# Patient Record
Sex: Female | Born: 1990 | Race: White | Hispanic: No | Smoking: Current every day smoker
Health system: Southern US, Community
[De-identification: ages and names within clinical notes are randomized; demographics above are authoritative.]

## PROBLEM LIST (undated history)

## (undated) DIAGNOSIS — J45909 Unspecified asthma, uncomplicated: Secondary | ICD-10-CM

## (undated) HISTORY — PX: TUBAL LIGATION: SHX77

## (undated) HISTORY — PX: WISDOM TOOTH EXTRACTION: SHX21

---

## 2008-09-27 ENCOUNTER — Emergency Department (HOSPITAL_COMMUNITY): Admission: EM | Admit: 2008-09-27 | Discharge: 2008-09-27 | Payer: Self-pay | Admitting: Emergency Medicine

## 2016-02-16 ENCOUNTER — Observation Stay (HOSPITAL_COMMUNITY)
Admission: EM | Admit: 2016-02-16 | Discharge: 2016-02-17 | Disposition: A | Discharge status: Home / Self Care | Payer: Medicaid Other | Attending: Family Medicine | Admitting: Family Medicine

## 2016-02-16 ENCOUNTER — Emergency Department (HOSPITAL_COMMUNITY): Payer: Medicaid Other

## 2016-02-16 ENCOUNTER — Encounter (HOSPITAL_COMMUNITY): Payer: Self-pay

## 2016-02-16 DIAGNOSIS — E876 Hypokalemia: Secondary | ICD-10-CM | POA: Diagnosis not present

## 2016-02-16 DIAGNOSIS — G92 Toxic encephalopathy: Secondary | ICD-10-CM | POA: Insufficient documentation

## 2016-02-16 DIAGNOSIS — Y909 Presence of alcohol in blood, level not specified: Secondary | ICD-10-CM | POA: Insufficient documentation

## 2016-02-16 DIAGNOSIS — R4182 Altered mental status, unspecified: Secondary | ICD-10-CM

## 2016-02-16 DIAGNOSIS — I959 Hypotension, unspecified: Secondary | ICD-10-CM | POA: Insufficient documentation

## 2016-02-16 DIAGNOSIS — F10921 Alcohol use, unspecified with intoxication delirium: Secondary | ICD-10-CM | POA: Diagnosis not present

## 2016-02-16 DIAGNOSIS — F1410 Cocaine abuse, uncomplicated: Secondary | ICD-10-CM

## 2016-02-16 DIAGNOSIS — F10129 Alcohol abuse with intoxication, unspecified: Secondary | ICD-10-CM | POA: Diagnosis not present

## 2016-02-16 DIAGNOSIS — X31XXXA Exposure to excessive natural cold, initial encounter: Secondary | ICD-10-CM | POA: Insufficient documentation

## 2016-02-16 DIAGNOSIS — R7989 Other specified abnormal findings of blood chemistry: Secondary | ICD-10-CM | POA: Diagnosis not present

## 2016-02-16 DIAGNOSIS — T68XXXA Hypothermia, initial encounter: Secondary | ICD-10-CM | POA: Diagnosis not present

## 2016-02-16 DIAGNOSIS — F10929 Alcohol use, unspecified with intoxication, unspecified: Secondary | ICD-10-CM

## 2016-02-16 DIAGNOSIS — F14129 Cocaine abuse with intoxication, unspecified: Secondary | ICD-10-CM | POA: Insufficient documentation

## 2016-02-16 HISTORY — DX: Unspecified asthma, uncomplicated: J45.909

## 2016-02-16 LAB — CBC WITH DIFFERENTIAL/PLATELET
Basophils Absolute: 0.1 10*3/uL (ref 0.0–0.1)
Basophils Relative: 1 %
EOS ABS: 0 10*3/uL (ref 0.0–0.7)
Eosinophils Relative: 0 %
HCT: 40.4 % (ref 36.0–46.0)
HEMOGLOBIN: 14 g/dL (ref 12.0–15.0)
LYMPHS ABS: 3.2 10*3/uL (ref 0.7–4.0)
Lymphocytes Relative: 27 %
MCH: 33.4 pg (ref 26.0–34.0)
MCHC: 34.7 g/dL (ref 30.0–36.0)
MCV: 96.4 fL (ref 78.0–100.0)
Monocytes Absolute: 0.6 10*3/uL (ref 0.1–1.0)
Monocytes Relative: 5 %
NEUTROS ABS: 7.9 10*3/uL — AB (ref 1.7–7.7)
NEUTROS PCT: 67 %
Platelets: 289 10*3/uL (ref 150–400)
RBC: 4.19 MIL/uL (ref 3.87–5.11)
RDW: 12.3 % (ref 11.5–15.5)
WBC: 11.7 10*3/uL — AB (ref 4.0–10.5)

## 2016-02-16 LAB — RAPID URINE DRUG SCREEN, HOSP PERFORMED
Amphetamines: NOT DETECTED
BARBITURATES: NOT DETECTED
Benzodiazepines: NOT DETECTED
Cocaine: POSITIVE — AB
Opiates: NOT DETECTED
Tetrahydrocannabinol: NOT DETECTED

## 2016-02-16 LAB — URINALYSIS, ROUTINE W REFLEX MICROSCOPIC
Bilirubin Urine: NEGATIVE
Glucose, UA: NEGATIVE mg/dL
Ketones, ur: NEGATIVE mg/dL
NITRITE: NEGATIVE
PROTEIN: NEGATIVE mg/dL
pH: 6.5 (ref 5.0–8.0)

## 2016-02-16 LAB — COMPREHENSIVE METABOLIC PANEL
ALBUMIN: 4.2 g/dL (ref 3.5–5.0)
ALT: 13 U/L — ABNORMAL LOW (ref 14–54)
AST: 18 U/L (ref 15–41)
Alkaline Phosphatase: 90 U/L (ref 38–126)
Anion gap: 10 (ref 5–15)
BUN: 10 mg/dL (ref 6–20)
CHLORIDE: 111 mmol/L (ref 101–111)
CO2: 20 mmol/L — AB (ref 22–32)
Calcium: 8.8 mg/dL — ABNORMAL LOW (ref 8.9–10.3)
Creatinine, Ser: 0.52 mg/dL (ref 0.44–1.00)
GFR calc Af Amer: >60 mL/min (ref >60)
GLUCOSE: 124 mg/dL — AB (ref 65–99)
POTASSIUM: 3.2 mmol/L — AB (ref 3.5–5.1)
SODIUM: 141 mmol/L (ref 135–145)
Total Bilirubin: 0.2 mg/dL — ABNORMAL LOW (ref 0.3–1.2)
Total Protein: 7.5 g/dL (ref 6.5–8.1)

## 2016-02-16 LAB — ACETAMINOPHEN LEVEL

## 2016-02-16 LAB — LACTIC ACID, PLASMA
LACTIC ACID, VENOUS: 2.2 mmol/L — AB (ref 0.5–1.9)
LACTIC ACID, VENOUS: 1.9 mmol/L (ref 0.5–1.9)

## 2016-02-16 LAB — URINE MICROSCOPIC-ADD ON

## 2016-02-16 LAB — MRSA PCR SCREENING: MRSA BY PCR: NEGATIVE

## 2016-02-16 LAB — ETHANOL: Alcohol, Ethyl (B): 465 mg/dL (ref <5)

## 2016-02-16 LAB — SALICYLATE LEVEL: Salicylate Lvl: <7 mg/dL (ref 2.8–30.0)

## 2016-02-16 LAB — CK: Total CK: 158 U/L (ref 38–234)

## 2016-02-16 LAB — PREGNANCY, URINE: PREG TEST UR: NEGATIVE

## 2016-02-16 MED ORDER — ORAL CARE MOUTH RINSE: 15.0000 mL | Freq: Two times a day (BID) | OROMUCOSAL | Status: DC

## 2016-02-16 MED ORDER — SODIUM CHLORIDE 0.9 % IV BOLUS (SEPSIS)
1000.0000 mL | Freq: Once | INTRAVENOUS | Status: AC
  Administered 2016-02-16: 1000 mL via INTRAVENOUS

## 2016-02-16 MED ORDER — LORAZEPAM 2 MG/ML IJ SOLN: 1.0000 mg | Freq: Once | INTRAMUSCULAR | Status: DC

## 2016-02-16 MED ORDER — CHLORHEXIDINE GLUCONATE 0.12 % MT SOLN: 15.0000 mL | Freq: Two times a day (BID) | OROMUCOSAL | Status: DC

## 2016-02-16 MED ORDER — SODIUM CHLORIDE 0.9 % IV SOLN
INTRAVENOUS | Status: DC
  Administered 2016-02-16 – 2016-02-17 (×3): via INTRAVENOUS

## 2016-02-16 MED ORDER — ADULT MULTIVITAMIN W/MINERALS CH: 1.0000 | ORAL_TABLET | Freq: Every day | ORAL | Status: DC

## 2016-02-16 MED ORDER — LORAZEPAM 2 MG/ML IJ SOLN: 0.0000 mg | Freq: Two times a day (BID) | INTRAMUSCULAR | Status: DC

## 2016-02-16 MED ORDER — LORAZEPAM 2 MG/ML IJ SOLN
INTRAMUSCULAR | Status: AC
  Administered 2016-02-16: 1 mg
  Filled 2016-02-16: qty 1

## 2016-02-16 MED ORDER — ONDANSETRON HCL 4 MG PO TABS: 4.0000 mg | ORAL_TABLET | Freq: Four times a day (QID) | ORAL | Status: DC | PRN

## 2016-02-16 MED ORDER — ONDANSETRON 4 MG PO TBDP: 4.0000 mg | ORAL_TABLET | Freq: Four times a day (QID) | ORAL | Status: DC | PRN

## 2016-02-16 MED ORDER — THIAMINE HCL 100 MG/ML IJ SOLN: 100.0000 mg | Freq: Once | INTRAMUSCULAR | Status: DC

## 2016-02-16 MED ORDER — SODIUM CHLORIDE 0.9% FLUSH
3.0000 mL | Freq: Two times a day (BID) | INTRAVENOUS | Status: DC
  Administered 2016-02-17: 3 mL via INTRAVENOUS

## 2016-02-16 MED ORDER — FOLIC ACID 1 MG PO TABS
1.0000 mg | ORAL_TABLET | Freq: Every day | ORAL | Status: DC
  Administered 2016-02-17: 1 mg via ORAL
  Filled 2016-02-16: qty 1

## 2016-02-16 MED ORDER — SODIUM CHLORIDE 0.9 % IV SOLN
INTRAVENOUS | Status: DC
  Administered 2016-02-16: 12:00:00 via INTRAVENOUS

## 2016-02-16 MED ORDER — LORAZEPAM 1 MG PO TABS: 1.0000 mg | ORAL_TABLET | Freq: Four times a day (QID) | ORAL | Status: DC

## 2016-02-16 MED ORDER — ACETAMINOPHEN 500 MG PO TABS: 1000.0000 mg | ORAL_TABLET | Freq: Four times a day (QID) | ORAL | Status: DC | PRN

## 2016-02-16 MED ORDER — ADULT MULTIVITAMIN W/MINERALS CH
1.0000 | ORAL_TABLET | Freq: Every day | ORAL | Status: DC
  Administered 2016-02-17: 1 via ORAL
  Filled 2016-02-16: qty 1

## 2016-02-16 MED ORDER — LORAZEPAM 1 MG PO TABS: 1.0000 mg | ORAL_TABLET | Freq: Two times a day (BID) | ORAL | Status: DC

## 2016-02-16 MED ORDER — VITAMIN B-1 100 MG PO TABS
100.0000 mg | ORAL_TABLET | Freq: Every day | ORAL | Status: DC
  Administered 2016-02-17: 100 mg via ORAL
  Filled 2016-02-16: qty 1

## 2016-02-16 MED ORDER — POTASSIUM CHLORIDE CRYS ER 20 MEQ PO TBCR
40.0000 meq | EXTENDED_RELEASE_TABLET | Freq: Once | ORAL | Status: AC
  Administered 2016-02-16: 40 meq via ORAL
  Filled 2016-02-16: qty 2

## 2016-02-16 MED ORDER — LOPERAMIDE HCL 2 MG PO CAPS: 2.0000 mg | ORAL_CAPSULE | ORAL | Status: DC | PRN

## 2016-02-16 MED ORDER — VITAMIN B-1 100 MG PO TABS: 100.0000 mg | ORAL_TABLET | Freq: Every day | ORAL | Status: DC

## 2016-02-16 MED ORDER — LORAZEPAM 1 MG PO TABS: 1.0000 mg | ORAL_TABLET | Freq: Four times a day (QID) | ORAL | Status: DC | PRN

## 2016-02-16 MED ORDER — LORAZEPAM 1 MG PO TABS: 1.0000 mg | ORAL_TABLET | Freq: Every day | ORAL | Status: DC

## 2016-02-16 MED ORDER — IBUPROFEN 400 MG PO TABS
600.0000 mg | ORAL_TABLET | Freq: Four times a day (QID) | ORAL | Status: DC | PRN
  Administered 2016-02-17 (×2): 600 mg via ORAL
  Filled 2016-02-16 (×2): qty 2

## 2016-02-16 MED ORDER — LORAZEPAM 2 MG/ML IJ SOLN
0.0000 mg | Freq: Four times a day (QID) | INTRAMUSCULAR | Status: DC
  Administered 2016-02-16 – 2016-02-17 (×3): 2 mg via INTRAVENOUS
  Filled 2016-02-16 (×4): qty 1

## 2016-02-16 MED ORDER — HYDROXYZINE HCL 25 MG PO TABS: 25.0000 mg | ORAL_TABLET | Freq: Four times a day (QID) | ORAL | Status: DC | PRN

## 2016-02-16 MED ORDER — LORAZEPAM 2 MG/ML IJ SOLN: 1.0000 mg | Freq: Four times a day (QID) | INTRAMUSCULAR | Status: DC | PRN

## 2016-02-16 MED ORDER — THIAMINE HCL 100 MG/ML IJ SOLN: 100.0000 mg | Freq: Every day | INTRAMUSCULAR | Status: DC

## 2016-02-16 MED ORDER — LORAZEPAM 1 MG PO TABS: 1.0000 mg | ORAL_TABLET | Freq: Three times a day (TID) | ORAL | Status: DC

## 2016-02-16 MED ORDER — ONDANSETRON HCL 4 MG/2ML IJ SOLN: 4.0000 mg | Freq: Four times a day (QID) | INTRAMUSCULAR | Status: DC | PRN

## 2016-02-16 MED ORDER — NICOTINE 14 MG/24HR TD PT24
14.0000 mg | MEDICATED_PATCH | Freq: Every day | TRANSDERMAL | Status: DC
  Administered 2016-02-16: 14 mg via TRANSDERMAL
  Filled 2016-02-16: qty 1

## 2016-02-16 MED ORDER — LORAZEPAM 1 MG PO TABS
1.0000 mg | ORAL_TABLET | Freq: Four times a day (QID) | ORAL | Status: DC | PRN
  Administered 2016-02-17: 1 mg via ORAL
  Filled 2016-02-16: qty 1

## 2016-02-16 NOTE — ED Notes (Signed)
CRITICAL VALUE ALERT  Critical value received:  etoh 465 Date of notification:  02/16/2016  Time of notification:  0856  Critical value read back:Yes.    Nurse who received alert:  [facility] RN  MD notified (1st page):  Dr. Clarene DukeMcManus  Time of first page:  [phone]  MD notified (2nd page):  Time of second page:  Responding MD:  Dr. Clarene DukeMcManus  Time MD responded:  [phone]

## 2016-02-16 NOTE — ED Notes (Signed)
CRITICAL VALUE ALERT  Critical value received:  Lactic Acid - 2.2  Date of notification:  02/16/2016  Time of notification:  0850  Critical value read back: yes  Nurse who received alert:  Camillia HerterSandra Kueider RN  MD notified (1st page):  Dr Clarene DukeMcManus  Time of first page:  0850  MD notified (2nd page):  Time of second page:  Responding MD:  Dr Clarene DukeMcManus  Time MD responded:  [phone]

## 2016-02-16 NOTE — ED Triage Notes (Signed)
Pt here via EMS. States pt was found lying on a ? Friends carport with a blanket on her. States pt was combative and cursing on their arrival . Pt was given Haldol 5 mg IM by EMS prior to arrival. Police brought in pt's two children that were at the scene. DDS here with police. Patient cursing, calling every one " GD fucking bitches "

## 2016-02-16 NOTE — Progress Notes (Signed)
Md notified of pt wanting to leave hospital, agitation and tremors. New orders received and noted.

## 2016-02-16 NOTE — ED Notes (Signed)
Patient remains sleeping at this time. Patient is arouseable by verbal stimuli. Bair hugger remains on patient. Warm fluids being given as bolus.

## 2016-02-16 NOTE — ED Provider Notes (Signed)
[facility]-EMERGENCY DEPT Provider Note   CSN: [id number] Arrival date & time: 02/16/16  0734     History   Chief Complaint Chief Complaint  Patient presents with  . Alcohol Intoxication    HPI Danielle Cain is a 25 y.o. female.  The history is provided by the patient, the EMS personnel and the police. The history is limited by the condition of the patient (AMS, intoxicated).  Alcohol Intoxication     Pt was seen at 0740.  Per EMS and Police: Pt was at a party last night, found by friends outside "passed out" at 0300. Friends apparently placed a blanket over her (outside temp approximately 28 degrees) because when awakened pt became combative and agitated. Police state they were called approximately 0500. Police called DSS to care for pt's 2 children that were on scene. EMS gave pt IM haldol 5mg  due to pt combativeness and agitation on scene and during transport. Pt arrival to ED shackled to stretcher x4 extremities, grabbing at staff and cursing, calling everyone "god damn fucking bitches." States she "took a lot of stuff" then laughs and returns cursing.    No past medical history on file.  There are no active problems to display for this patient.   No past surgical history on file.  OB History    No data available       Home Medications    Prior to Admission medications   Not on File    Family History  Social History Social History  Substance Use Topics  . Smoking status: Not on file  . Smokeless tobacco: Not on file  . Alcohol use Not on file     Allergies   Patient has no allergy information on record.   Review of Systems Review of Systems  Unable to perform ROS: Mental status change     Physical Exam Updated Vital Signs BP 101/69 (BP Location: Left Arm)   Pulse 88   Temp (!) 95 F (35 C) (Core (Comment))   Resp 10   SpO2 99%    Patient Vitals for the past 24 hrs:  BP Temp Temp src Pulse Resp SpO2  02/16/16 1200 (!) 84/46 97 F (36.1 C) - 86  16 99 %  02/16/16 1130 (!) 83/43 (!) 96.4 F (35.8 C) - 80 16 100 %  02/16/16 1100 (!) 80/45 (!) 95.9 F (35.5 C) - 74 15 100 %  02/16/16 1030 (!) 81/41 (!) 95.4 F (35.2 C) - 69 15 100 %  02/16/16 1000 90/56 (!) 95.4 F (35.2 C) - 77 16 100 %  02/16/16 0930 (!) 83/47 (!) 95.4 F (35.2 C) - (!) 56 14 100 %  02/16/16 0908 96/66 (!) 95 F (35 C) Core 60 15 100 %  02/16/16 0830 (!) 82/48 (!) 94.8 F (34.9 C) - (!) 59 16 99 %  02/16/16 0806 101/69 (!) 95 F (35 C) Core 88 10 99 %  02/16/16 0800 101/69 (!) 95 F (35 C) - 86 15 99 %     Physical Exam 0745: Physical examination:  Nursing notes reviewed; Vital signs and O2 SAT reviewed;  Constitutional: Well developed, Well nourished, Well hydrated, In no acute distress; Head:  Normocephalic, +mild localized erythema and edema to left lateral zygoma area.; Eyes: EOMI, pupils dilated bilat. No scleral icterus; ENMT: Mouth and pharynx normal, Mucous membranes dry and crusted; Neck: Supple, Full range of motion, No lymphadenopathy; Cardiovascular: Regular rate and rhythm, No gallop; Respiratory: Breath sounds clear & equal  bilaterally, No wheezes.  Speaking full sentences with ease, Normal respiratory effort/excursion; Chest: Nontender, Movement normal; Abdomen: Soft, Nontender, Nondistended, Normal bowel sounds; Genitourinary: No CVA tenderness; Extremities: +ecchymosis right anterior tibial area. Pulses normal, No deformity, no tenderness, No edema, No calf edema or asymmetry.; Neuro: Awake, alert, confused re: events. Major CN grossly intact.  Speech slurred. Moves all extremities spontaneously without apparent gross focal motor deficits.; Skin: Color normal, cool, Dry.   ED Treatments / Results  Labs (all labs ordered are listed, but only abnormal results are displayed)   EKG  EKG Interpretation  Date/Time:  Tuesday February 16 2016 08:18:24 EST Ventricular Rate:  58 PR Interval:    QRS Duration: 87 QT Interval:  458 QTC  Calculation: 450 R Axis:   82 Text Interpretation:  Sinus rhythm Baseline wander Artifact SUGGEST REPEAT TRACING Confirmed by [facility]  MD, Nicholos Johns [phone]) on 02/16/2016 8:38:08 AM       Radiology   Procedures Procedures (including critical care time)  Medications Ordered in ED Medications - No data to display   Initial Impression / Assessment and Plan / ED Course  I have reviewed the triage vital signs and the nursing notes.  Pertinent labs & imaging results that were available during my care of the patient were reviewed by me and considered in my medical decision making (see chart for details).  MDM Reviewed: nursing note and vitals Interpretation: labs, ECG, x-ray and CT scan Total time providing critical care: 30-74 minutes. This excludes time spent performing separately reportable procedures and services. Consults: admitting MD   CRITICAL CARE Performed by: Laray Anger Total critical care time: 35 minutes Critical care time was exclusive of separately billable procedures and treating other patients. Critical care was necessary to treat or prevent imminent or life-threatening deterioration. Critical care was time spent personally by me on the following activities: development of treatment plan with patient and/or surrogate as well as nursing, discussions with consultants, evaluation of patient's response to treatment, examination of patient, obtaining history from patient or surrogate, ordering and performing treatments and interventions, ordering and review of laboratory studies, ordering and review of radiographic studies, pulse oximetry and re-evaluation of patient's condition.   Dg Chest 1 View Result Date: 02/16/2016 CLINICAL DATA:  Found unresponsive EXAM: CHEST 1 VIEW COMPARISON:  None. FINDINGS: Heart and mediastinal contours are within normal limits. No focal opacities or effusions. No acute bony abnormality. IMPRESSION: No active disease. Electronically  Signed   By: Charlett Nose M.D.   On: 02/16/2016 09:01   Ct Head Wo Contrast Result Date: 02/16/2016 CLINICAL DATA:  Found outside. Combative. Swelling around the left eye. EXAM: CT HEAD WITHOUT CONTRAST CT MAXILLOFACIAL WITHOUT CONTRAST CT CERVICAL SPINE WITHOUT CONTRAST TECHNIQUE: Multidetector CT imaging of the head, cervical spine, and maxillofacial structures were performed using the standard protocol without intravenous contrast. Multiplanar CT image reconstructions of the cervical spine and maxillofacial structures were also generated. COMPARISON:  None. FINDINGS: CT HEAD FINDINGS Brain: Normal. No evidence of acute infarction, hemorrhage, hydrocephalus, extra-axial collection or mass lesion/mass effect. Vascular: No hyperdense vessel or unexpected calcification. Skull: Negative for fracture CT MAXILLOFACIAL FINDINGS Osseous: No fracture or mandibular dislocation. Dental caries with bilateral lower premolar periapical erosions. Orbits: Negative. No traumatic or inflammatory finding. Sinuses: Clear. Soft tissues: Negative. CT CERVICAL SPINE FINDINGS Alignment: No traumatic malalignment. Skull base and vertebrae: No evidence of acute fracture. Irregularity at the level of the C5-C7 vertebrae is best attributed to motion artifact. Minimal superior endplate height loss at  T1 and T2 has a chronic appearance. Soft tissues and spinal canal: No prevertebral fluid or swelling. No visible canal hematoma. Disc levels:  No significant degenerative change. Upper chest: Negative IMPRESSION: 1. Negative head CT. 2. No acute finding in the face.  Negative for facial fracture. 3. Motion degraded cervical spine CT without evidence of acute injury. Electronically Signed   By: Marnee SpringJonathon  Watts M.D.   On: 02/16/2016 09:12   Ct Cervical Spine Wo Contrast Result Date: 02/16/2016 CLINICAL DATA:  Found outside. Combative. Swelling around the left eye. EXAM: CT HEAD WITHOUT CONTRAST CT MAXILLOFACIAL WITHOUT CONTRAST CT CERVICAL  SPINE WITHOUT CONTRAST TECHNIQUE: Multidetector CT imaging of the head, cervical spine, and maxillofacial structures were performed using the standard protocol without intravenous contrast. Multiplanar CT image reconstructions of the cervical spine and maxillofacial structures were also generated. COMPARISON:  None. FINDINGS: CT HEAD FINDINGS Brain: Normal. No evidence of acute infarction, hemorrhage, hydrocephalus, extra-axial collection or mass lesion/mass effect. Vascular: No hyperdense vessel or unexpected calcification. Skull: Negative for fracture CT MAXILLOFACIAL FINDINGS Osseous: No fracture or mandibular dislocation. Dental caries with bilateral lower premolar periapical erosions. Orbits: Negative. No traumatic or inflammatory finding. Sinuses: Clear. Soft tissues: Negative. CT CERVICAL SPINE FINDINGS Alignment: No traumatic malalignment. Skull base and vertebrae: No evidence of acute fracture. Irregularity at the level of the C5-C7 vertebrae is best attributed to motion artifact. Minimal superior endplate height loss at T1 and T2 has a chronic appearance. Soft tissues and spinal canal: No prevertebral fluid or swelling. No visible canal hematoma. Disc levels:  No significant degenerative change. Upper chest: Negative IMPRESSION: 1. Negative head CT. 2. No acute finding in the face.  Negative for facial fracture. 3. Motion degraded cervical spine CT without evidence of acute injury. Electronically Signed   By: Marnee SpringJonathon  Watts M.D.   On: 02/16/2016 09:12   Ct Maxillofacial Wo Cm Result Date: 02/16/2016 CLINICAL DATA:  Found outside. Combative. Swelling around the left eye. EXAM: CT HEAD WITHOUT CONTRAST CT MAXILLOFACIAL WITHOUT CONTRAST CT CERVICAL SPINE WITHOUT CONTRAST TECHNIQUE: Multidetector CT imaging of the head, cervical spine, and maxillofacial structures were performed using the standard protocol without intravenous contrast. Multiplanar CT image reconstructions of the cervical spine and  maxillofacial structures were also generated. COMPARISON:  None. FINDINGS: CT HEAD FINDINGS Brain: Normal. No evidence of acute infarction, hemorrhage, hydrocephalus, extra-axial collection or mass lesion/mass effect. Vascular: No hyperdense vessel or unexpected calcification. Skull: Negative for fracture CT MAXILLOFACIAL FINDINGS Osseous: No fracture or mandibular dislocation. Dental caries with bilateral lower premolar periapical erosions. Orbits: Negative. No traumatic or inflammatory finding. Sinuses: Clear. Soft tissues: Negative. CT CERVICAL SPINE FINDINGS Alignment: No traumatic malalignment. Skull base and vertebrae: No evidence of acute fracture. Irregularity at the level of the C5-C7 vertebrae is best attributed to motion artifact. Minimal superior endplate height loss at T1 and T2 has a chronic appearance. Soft tissues and spinal canal: No prevertebral fluid or swelling. No visible canal hematoma. Disc levels:  No significant degenerative change. Upper chest: Negative IMPRESSION: 1. Negative head CT. 2. No acute finding in the face.  Negative for facial fracture. 3. Motion degraded cervical spine CT without evidence of acute injury. Electronically Signed   By: Marnee SpringJonathon  Watts M.D.   On: 02/16/2016 09:12    1050:  Pt initially combative and agitated on arrival to ED. Slowly fell asleep (s/p IM haldol en route). Remains arousable to verbal stimuli. Multiple IVF boluses given for low BP and elevated lactic acid with slow improvement.  Bair hugger in place, temp slowly increasing.  T/C to [facility] Dr. Irene LimboGoodrich, case discussed, including:  HPI, pertinent PM/SHx, VS/PE, dx testing, ED course and treatment:  Agreeable to admit, requests to write temporary orders, obtain stepdown bed to team APAdmits.       Final Clinical Impressions(s) / ED Diagnoses   Final diagnoses:  None    New Prescriptions New Prescriptions   No medications on file      Samuel JesterKathleen Demareon Coldwell, DO 02/20/16 0022
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
## 2016-02-17 ENCOUNTER — Encounter (HOSPITAL_COMMUNITY): Payer: Self-pay

## 2016-02-17 DIAGNOSIS — T68XXXA Hypothermia, initial encounter: Secondary | ICD-10-CM

## 2016-02-17 DIAGNOSIS — F10921 Alcohol use, unspecified with intoxication delirium: Secondary | ICD-10-CM

## 2016-02-17 LAB — BASIC METABOLIC PANEL
Anion gap: 7 (ref 5–15)
Anion gap: 7 (ref 5–15)
BUN: 7 mg/dL (ref 6–20)
BUN: 6 mg/dL (ref 6–20)
CHLORIDE: 107 mmol/L (ref 101–111)
CHLORIDE: 106 mmol/L (ref 101–111)
CO2: 21 mmol/L — AB (ref 22–32)
CO2: 19 mmol/L — AB (ref 22–32)
CREATININE: 0.68 mg/dL (ref 0.44–1.00)
CREATININE: 0.52 mg/dL (ref 0.44–1.00)
Calcium: 7.8 mg/dL — ABNORMAL LOW (ref 8.9–10.3)
Calcium: 8.2 mg/dL — ABNORMAL LOW (ref 8.9–10.3)
GFR calc Af Amer: >60 mL/min (ref >60)
GFR calc Af Amer: >60 mL/min (ref >60)
GFR calc non Af Amer: >60 mL/min (ref >60)
GFR calc non Af Amer: >60 mL/min (ref >60)
GLUCOSE: 118 mg/dL — AB (ref 65–99)
Glucose, Bld: 100 mg/dL — ABNORMAL HIGH (ref 65–99)
POTASSIUM: 3.2 mmol/L — AB (ref 3.5–5.1)
POTASSIUM: 3.9 mmol/L (ref 3.5–5.1)
SODIUM: 135 mmol/L (ref 135–145)
SODIUM: 132 mmol/L — AB (ref 135–145)

## 2016-02-17 MED ORDER — FOLIC ACID 1 MG PO TABS: 1.0000 mg | ORAL_TABLET | Freq: Every day | ORAL | 2 refills | Status: AC

## 2016-02-17 MED ORDER — POTASSIUM CHLORIDE CRYS ER 20 MEQ PO TBCR
40.0000 meq | EXTENDED_RELEASE_TABLET | ORAL | Status: AC
  Administered 2016-02-17 (×2): 40 meq via ORAL
  Filled 2016-02-17 (×2): qty 2

## 2016-02-17 MED ORDER — THIAMINE HCL 100 MG PO TABS: 100.0000 mg | ORAL_TABLET | Freq: Every day | ORAL | 2 refills | Status: AC

## 2016-02-17 MED ORDER — NICOTINE 21 MG/24HR TD PT24
21.0000 mg | MEDICATED_PATCH | Freq: Every day | TRANSDERMAL | Status: DC
  Administered 2016-02-17: 21 mg via TRANSDERMAL
  Filled 2016-02-17: qty 1

## 2016-02-17 NOTE — Progress Notes (Signed)
Midlevel notified of pt reported pain to buttocks and head rated 8/10. Pt refused both tylenol and ibuprofen and ice pack.

## 2016-02-17 NOTE — Care Management Note (Signed)
Case Management Note  Patient Details  Name: Danielle Cain MRN: [id number] Date of Birth: 16-Aug-1990  Subjective/Objective:                  Pt admitted with ETOH intoxication. Pt is from home, ind with ADL's. Pt has medicaid [facility] Pt plans to return home with self care. Pt has seen CSW for substance abuse.   Action/Plan: No CM needs anticipated. Pt anticipates DC home this afternoon.   Expected Discharge Date:     02/17/2016             Expected Discharge Plan:  Home/Self Care  In-House Referral:  Clinical Social Work  Discharge planning Services  CM Consult  Post Acute Care Choice:  NA Choice offered to:  NA  Status of Service:  Completed, signed off  Malcolm MetroChildress, Otilia Demske, RN 02/17/2016, 3:55 PM
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
## 2016-02-17 NOTE — Progress Notes (Signed)
Midlevel notified of pt request to smoke. New orders for nicotine patch.
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
## 2016-02-17 NOTE — Progress Notes (Signed)
Patient discharged to home.  Instruction and prescription given to patient. Verbalized understanding.  Patient walking down to car with family members.

## 2016-03-03 ENCOUNTER — Encounter (HOSPITAL_COMMUNITY): Payer: Self-pay | Admitting: Emergency Medicine

## 2016-03-03 ENCOUNTER — Emergency Department (HOSPITAL_COMMUNITY): Payer: Medicaid Other

## 2016-03-03 ENCOUNTER — Emergency Department (HOSPITAL_COMMUNITY)
Admission: EM | Admit: 2016-03-03 | Discharge: 2016-03-03 | Disposition: A | Discharge status: Home / Self Care | Payer: Medicaid Other | Attending: Emergency Medicine | Admitting: Emergency Medicine

## 2016-03-03 DIAGNOSIS — R0789 Other chest pain: Secondary | ICD-10-CM | POA: Diagnosis not present

## 2016-03-03 DIAGNOSIS — F17210 Nicotine dependence, cigarettes, uncomplicated: Secondary | ICD-10-CM | POA: Insufficient documentation

## 2016-03-03 DIAGNOSIS — J45909 Unspecified asthma, uncomplicated: Secondary | ICD-10-CM | POA: Insufficient documentation

## 2016-03-03 DIAGNOSIS — R079 Chest pain, unspecified: Secondary | ICD-10-CM | POA: Diagnosis present

## 2016-03-03 LAB — CBC
HCT: 39.4 % (ref 36.0–46.0)
HEMOGLOBIN: 13.5 g/dL (ref 12.0–15.0)
MCH: 33 pg (ref 26.0–34.0)
MCHC: 34.3 g/dL (ref 30.0–36.0)
MCV: 96.3 fL (ref 78.0–100.0)
Platelets: 321 10*3/uL (ref 150–400)
RBC: 4.09 MIL/uL (ref 3.87–5.11)
RDW: 12.3 % (ref 11.5–15.5)
WBC: 10.7 10*3/uL — ABNORMAL HIGH (ref 4.0–10.5)

## 2016-03-03 LAB — BASIC METABOLIC PANEL
ANION GAP: 10 (ref 5–15)
BUN: 13 mg/dL (ref 6–20)
CALCIUM: 9.7 mg/dL (ref 8.9–10.3)
CO2: 22 mmol/L (ref 22–32)
CREATININE: 0.66 mg/dL (ref 0.44–1.00)
Chloride: 105 mmol/L (ref 101–111)
GLUCOSE: 106 mg/dL — AB (ref 65–99)
Potassium: 4.1 mmol/L (ref 3.5–5.1)
Sodium: 137 mmol/L (ref 135–145)

## 2016-03-03 LAB — I-STAT TROPONIN, ED
TROPONIN I, POC: 0 ng/mL (ref 0.00–0.08)
Troponin i, poc: 0 ng/mL (ref 0.00–0.08)

## 2016-03-03 MED ORDER — GI COCKTAIL ~~LOC~~
30.0000 mL | Freq: Once | ORAL | Status: AC
  Administered 2016-03-03: 30 mL via ORAL
  Filled 2016-03-03: qty 30

## 2016-03-03 NOTE — ED Triage Notes (Signed)
EMS vitals: BP 131/83, 97% on room air, ST 100

## 2016-03-03 NOTE — ED Notes (Signed)
Declined W/C at D/C and was escorted to lobby by RN. 

## 2016-03-03 NOTE — ED Provider Notes (Signed)
[facility]-EMERGENCY DEPT Provider Note   CSN: [id number] Arrival date & time: 03/03/16  1331    By signing my name below, I, Freida Busmaniana Omoyeni, attest that this documentation has been prepared under the direction and in the presence of Nira ConnPedro Eduardo Cardama, MD . Electronically Signed: Freida Busmaniana Omoyeni, Scribe. 03/03/2016. 3:18 PM.  History   Chief Complaint Chief Complaint  Patient presents with  . Chest Pain    The history is provided by the patient. No language interpreter was used.     HPI Comments:  Danielle Cain is a 25 y.o. female who presents to the Emergency Department complaining of stabbing, left sided CP that has been intermittent since onset ~1100 this AM. Her pain is non-radiating and each episode lasts ~ 30 seconds. She notes sitting upright improves her pain.  She denies h/o similar pain and states the pain has resolved at this time.  Pt reports associated numbness to the right arm, which began when her CP began and has been constant since. She also notes SOB that has resolved. Pt has a h/o low potassium and states she was recently treated for it; notes she should be taking potassium pills but has not taken her pills in 5 days because of where she is currently staying. She denies cough, abdominal pain, and fever. She had 1 episode of vomiting ~0100 this am; no nausea or vomiting since chest pain began. No ETOH, or recreational drug use. No recent travel or surgery. No use of BCP. Pt is not sexually active.   Past Medical History:  Diagnosis Date  . Asthma     Patient Active Problem List   Diagnosis Date Noted  . Alcohol intoxication (HCC) 02/16/2016  . Hypothermia 02/16/2016    History reviewed. No pertinent surgical history.  OB History    No data available       Home Medications    Prior to Admission medications   Medication Sig Start Date End Date Taking? Authorizing Provider  folic acid (FOLVITE) 1 MG tablet Take 1 tablet (1 mg total) by mouth daily. 02/18/16    Meredeth IdeGagan S Lama, MD  thiamine 100 MG tablet Take 1 tablet (100 mg total) by mouth daily. 02/18/16   Meredeth IdeGagan S Lama, MD    Family History Family History  Problem Relation Age of Onset  . Lung cancer Mother     Social History Social History  Substance Use Topics  . Smoking status: Current Every Day Smoker    Packs/day: 1.00    Years: 10.00    Types: Cigarettes  . Smokeless tobacco: Never Used  . Alcohol use Yes     Comment: intermittent     Allergies   Patient has no known allergies.   Review of Systems Review of Systems  10 systems reviewed and all are negative for acute change except as noted in the HPI.  Physical Exam Updated Vital Signs BP 127/77 (BP Location: Right Arm)   Pulse 93   Temp 97.8 F (36.6 C) (Oral)   Resp 12   Ht 5\' 4"  (1.626 m)   Wt 115 lb (52.2 kg)   LMP  (LMP Unknown) Comment: Patient unresponsive, not imaging abdomen  SpO2 97%   BMI 19.74 kg/m   Physical Exam  Cardiovascular: Normal rate, regular rhythm and normal heart sounds.   Pulmonary/Chest: Effort normal and breath sounds normal. No respiratory distress. She exhibits no tenderness.  Abdominal: Soft. There is no tenderness.  Musculoskeletal: She exhibits no edema.  Nursing note and  vitals reviewed.    ED Treatments / Results  DIAGNOSTIC STUDIES:  Oxygen Saturation is 100% on RA, Normal by my interpretation.    COORDINATION OF CARE:  3:14 PM Discussed treatment plan with pt at bedside and pt agreed to plan. Labs (all labs ordered are listed, but only abnormal results are displayed) Labs Reviewed  BASIC METABOLIC PANEL - Abnormal; Notable for the following:       Result Value   Glucose, Bld 106 (*)    All other components within normal limits  CBC - Abnormal; Notable for the following:    WBC 10.7 (*)    All other components within normal limits  I-STAT TROPOININ, ED  I-STAT TROPOININ, ED    EKG  EKG Interpretation  Date/Time:  Thursday March 03 2016 13:36:52  EST Ventricular Rate:  88 PR Interval:  124 QRS Duration: 80 QT Interval:  362 QTC Calculation: 438 R Axis:   86 Text Interpretation:  Normal sinus rhythm Right atrial enlargement Borderline ECG No old tracing to compare Confirmed by [facility] MD, PEDRO [id number]) on 03/03/2016 3:08:04 PM       Radiology Dg Chest 2 View  Result Date: 03/03/2016 CLINICAL DATA:  Pt c/o left-sided and superior chest pain and N/V since 11am today. Pt has had this happen before due to low potassium. Hx of asthma. Pt is a current smoker. EXAM: CHEST  2 VIEW COMPARISON:  None. FINDINGS: The heart size and mediastinal contours are within normal limits. Both lungs are clear. The visualized skeletal structures are unremarkable. IMPRESSION: No active cardiopulmonary disease. Electronically Signed   By: Norva PavlovElizabeth  Brown M.D.   On: 03/03/2016 14:30    Procedures Procedures (including critical care time)  Medications Ordered in ED Medications  gi cocktail (Maalox,Lidocaine,Donnatal) (30 mLs Oral Given 03/03/16 1725)     Initial Impression / Assessment and Plan / ED Course  I have reviewed the triage vital signs and the nursing notes.  Pertinent labs & imaging results that were available during my care of the patient were reviewed by me and considered in my medical decision making (see chart for details).  Clinical Course as of Mar 03 1728  Thu Mar 03, 2016  1520 Atypical chest pain. EKG without acute ischemia or evidence of pericarditis. Chest x-ray without evidence suggestive of pneumonia, pneumothorax, pneumomediastinum.  No abnormal contour of the mediastinum to suggest dissection. No evidence of acute injuries. Initial trop negative HEART <4. Will obtain Delta trop to rule out ACS.   PERC negative; doubt PE. Presentation is classic for aortic dissection or esophageal perforation.  Pain completely resolved following GI cocktail. Likely GI in nature.  K+ WNL.   [PC]  1729 Delta trop negative.  The patient  is safe for discharge with strict return precautions.   [PC]    Clinical Course User Index [PC] Nira ConnPedro Eduardo Cardama, MD      Final Clinical Impressions(s) / ED Diagnoses   Final diagnoses:  Nonspecific chest pain   Disposition: Discharge  Condition: Good  I have discussed the results, Dx and Tx plan with the patient who expressed understanding and agree(s) with the plan. Discharge instructions discussed at great length. The patient was given strict return precautions who verbalized understanding of the instructions. No further questions at time of discharge.    Current Discharge Medication List      Follow Up: primary care provider  Schedule an appointment as soon as possible for a visit  As needed   I personally performed  the services described in this documentation, which was scribed in my presence. The recorded information has been reviewed and is accurate.        Nira Conn, MD 03/03/16 [phone]

## 2016-03-03 NOTE — ED Triage Notes (Signed)
Pt complaining of CP and numbness/tingling to all extremities. Pt also complaining of dark stools- takes iron pills along with N/V since yesterday.

## 2016-11-02 ENCOUNTER — Emergency Department (HOSPITAL_COMMUNITY)
Admission: EM | Admit: 2016-11-02 | Discharge: 2016-11-02 | Disposition: A | Discharge status: Home / Self Care | Payer: Medicaid Other | Attending: Emergency Medicine | Admitting: Emergency Medicine

## 2016-11-02 ENCOUNTER — Encounter (HOSPITAL_COMMUNITY): Payer: Self-pay | Admitting: Cardiology

## 2016-11-02 DIAGNOSIS — J45909 Unspecified asthma, uncomplicated: Secondary | ICD-10-CM | POA: Insufficient documentation

## 2016-11-02 DIAGNOSIS — Z79899 Other long term (current) drug therapy: Secondary | ICD-10-CM | POA: Insufficient documentation

## 2016-11-02 DIAGNOSIS — T63441A Toxic effect of venom of bees, accidental (unintentional), initial encounter: Secondary | ICD-10-CM | POA: Insufficient documentation

## 2016-11-02 DIAGNOSIS — F17210 Nicotine dependence, cigarettes, uncomplicated: Secondary | ICD-10-CM | POA: Insufficient documentation

## 2016-11-02 MED ORDER — IBUPROFEN 400 MG PO TABS
600.0000 mg | ORAL_TABLET | Freq: Once | ORAL | Status: AC
  Administered 2016-11-02: 600 mg via ORAL
  Filled 2016-11-02: qty 2

## 2016-11-02 MED ORDER — FAMOTIDINE 20 MG PO TABS
20.0000 mg | ORAL_TABLET | Freq: Once | ORAL | Status: AC
  Administered 2016-11-02: 20 mg via ORAL
  Filled 2016-11-02: qty 1

## 2016-11-02 MED ORDER — FAMOTIDINE 20 MG PO TABS: 20.0000 mg | ORAL_TABLET | Freq: Two times a day (BID) | ORAL | 0 refills | Status: AC

## 2016-11-02 MED ORDER — DIPHENHYDRAMINE HCL 25 MG PO CAPS
25.0000 mg | ORAL_CAPSULE | Freq: Once | ORAL | Status: AC
  Administered 2016-11-02: 25 mg via ORAL
  Filled 2016-11-02: qty 1

## 2016-11-02 MED ORDER — DEXAMETHASONE SODIUM PHOSPHATE 4 MG/ML IJ SOLN
4.0000 mg | Freq: Once | INTRAMUSCULAR | Status: AC
  Administered 2016-11-02: 4 mg via INTRAMUSCULAR
  Filled 2016-11-02: qty 1
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
## 2016-11-02 NOTE — ED Triage Notes (Signed)
Bee sting to left side of face one hour ago.  Pt has swelling to left eye.  Took 1 benadryl at home

## 2016-11-02 NOTE — Discharge Instructions (Signed)
Please read instructions below. Begin taking 25 mg of Benadryl every 6 hours, as well as 20 mg of Pepcid every 12 hours, for your reaction. Apply cool compresses to your eye for added pain and swelling relief. You can take Advil every 6 hours as needed for pain. Follow-up with your primary care provider as needed if symptoms persist. Return to the ER immediately for swelling of her lips or tongue, difficulty breathing or tightness sensation in your throat, or new or worsening symptoms.

## 2016-11-02 NOTE — ED Triage Notes (Signed)
Pt still not in waiting area.

## 2016-11-02 NOTE — ED Provider Notes (Signed)
[facility]-EMERGENCY DEPT Provider Note   CSN: [id number] Arrival date & time: 11/02/16  1410     History   Chief Complaint Chief Complaint  Patient presents with  . Insect Bite    HPI Danielle Cain is a 26 y.o. female with PMHx asthma, Stenting with left eye swelling and pain after a bee sting that occurred around 1430 this afternoon. Patient states she was stung just below her left eyebrow around 1430 and immediately took 25 mg of Benadryl without relief. She denies lip or tongue swelling, difficulty breathing or swallowing, chest pain, nausea, or any other symptoms. No known allergy to hymenoptera stings.  The history is provided by the patient.    Past Medical History:  Diagnosis Date  . Asthma     Patient Active Problem List   Diagnosis Date Noted  . Alcohol intoxication (HCC) 02/16/2016  . Hypothermia 02/16/2016    Past Surgical History:  Procedure Laterality Date  . TUBAL LIGATION    . WISDOM TOOTH EXTRACTION      OB History    No data available       Home Medications    Prior to Admission medications   Medication Sig Start Date End Date Taking? Authorizing Provider  famotidine (PEPCID) 20 MG tablet Take 1 tablet (20 mg total) by mouth 2 (two) times daily. 11/02/16   Russo, [place] N, PA-C  folic acid (FOLVITE) 1 MG tablet Take 1 tablet (1 mg total) by mouth daily. 02/18/16   Meredeth IdeLama, Gagan S, MD  thiamine 100 MG tablet Take 1 tablet (100 mg total) by mouth daily. 02/18/16   Meredeth IdeLama, Gagan S, MD    Family History Family History  Problem Relation Age of Onset  . Lung cancer Mother     Social History Social History  Substance Use Topics  . Smoking status: Current Every Day Smoker    Packs/day: 1.00    Years: 10.00    Types: Cigarettes  . Smokeless tobacco: Never Used  . Alcohol use Yes     Comment: intermittent     Allergies   Patient has no known allergies.   Review of Systems Review of Systems  Constitutional: Negative for fever.  HENT: Negative  for sore throat and trouble swallowing.   Eyes: Positive for visual disturbance (2/t eyelid edema ).       Pain and swelling of the eyelid, no eye pain  Respiratory: Negative for cough, chest tightness, shortness of breath and stridor.   Cardiovascular: Negative for chest pain.  Gastrointestinal: Negative for abdominal pain, nausea and vomiting.  All other systems reviewed and are negative.    Physical Exam Updated Vital Signs BP 128/71 (BP Location: Left Arm)   Pulse 87   Temp 98.2 F (36.8 C) (Oral)   Resp 16   Ht 5\' 6"  (1.676 m)   Wt 68 kg (150 lb)   SpO2 99%   BMI 24.21 kg/m   Physical Exam  Constitutional: She appears well-developed and well-nourished. No distress.  Tolerating secretions  HENT:  Head: Normocephalic and atraumatic.  Mouth/Throat: Oropharynx is clear and moist.  No uvula swelling or trismus. No lip or tongue swelling. Nares are patent  Eyes: Pupils are equal, round, and reactive to light. Conjunctivae and EOM are normal.  Left upper and lower eyelids with edema and erythema, TTP. No conjunctival injection. Small punctate papule just below the medial border of left eyebrow, no stinger visible.  Neck: Normal range of motion. Neck supple. No tracheal deviation present.  Cardiovascular: Normal rate, regular rhythm, normal heart sounds and intact distal pulses.   Pulmonary/Chest: Effort normal and breath sounds normal. No stridor. No respiratory distress. She has no wheezes. She has no rales.  Abdominal: Soft.  Lymphadenopathy:    She has no cervical adenopathy.  Neurological: She is alert.  Skin: Skin is warm.  Psychiatric: She has a normal mood and affect. Her behavior is normal.  Nursing note and vitals reviewed.    ED Treatments / Results  Labs (all labs ordered are listed, but only abnormal results are displayed) Labs Reviewed - No data to display  EKG  EKG Interpretation None       Radiology No results found.  Procedures Procedures  (including critical care time)  Medications Ordered in ED Medications  diphenhydrAMINE (BENADRYL) capsule 25 mg (25 mg Oral Given 11/02/16 1643)  famotidine (PEPCID) tablet 20 mg (20 mg Oral Given 11/02/16 1643)  dexamethasone (DECADRON) injection 4 mg (4 mg Intramuscular Given 11/02/16 1643)  ibuprofen (ADVIL,MOTRIN) tablet 600 mg (600 mg Oral Given 11/02/16 1715)     Initial Impression / Assessment and Plan / ED Course  I have reviewed the triage vital signs and the nursing notes.  Pertinent labs & imaging results that were available during my care of the patient were reviewed by me and considered in my medical decision making (see chart for details).     Patient with bee sting near left eye with localized reaction. No symptoms or signs of anaphylaxis. Benadryl, Pepcid, Decadron given in ED. Patient re-evaluated prior to dc, is hemodynamically stable, in no respiratory distress, and denies the feeling of throat closing. Pt has been advised to take OTC benadryl & Pepcid, and to return to the ED if they have a mod-severe allergic rxn (s/s including throat closing, difficulty breathing, swelling of lips face or tongue). Pt is to follow up with their PCP. Pt is agreeable with plan & verbalizes understanding.  Discussed results, findings, treatment and follow up. Patient advised of return precautions. Patient verbalized understanding and agreed with plan.  Final Clinical Impressions(s) / ED Diagnoses   Final diagnoses:  Local reaction to bee sting, accidental or unintentional, initial encounter    New Prescriptions Discharge Medication List as of 11/02/2016  5:16 PM    START taking these medications   Details  famotidine (PEPCID) 20 MG tablet Take 1 tablet (20 mg total) by mouth 2 (two) times daily., Starting Wed 11/02/2016, Print         Timothy Lasso, [place] N, PA-C 11/02/16 1726    Samuel Jester, DO 11/04/16 1726

## 2016-11-24 ENCOUNTER — Encounter (HOSPITAL_COMMUNITY): Payer: Self-pay | Admitting: Emergency Medicine

## 2016-11-24 ENCOUNTER — Emergency Department (HOSPITAL_COMMUNITY)
Admission: EM | Admit: 2016-11-24 | Discharge: 2016-11-25 | Disposition: A | Discharge status: Home / Self Care | Payer: Self-pay | Attending: Emergency Medicine | Admitting: Emergency Medicine

## 2016-11-24 DIAGNOSIS — M791 Myalgia: Secondary | ICD-10-CM | POA: Insufficient documentation

## 2016-11-24 DIAGNOSIS — M2550 Pain in unspecified joint: Secondary | ICD-10-CM | POA: Insufficient documentation

## 2016-11-24 DIAGNOSIS — Z79899 Other long term (current) drug therapy: Secondary | ICD-10-CM | POA: Insufficient documentation

## 2016-11-24 DIAGNOSIS — F17210 Nicotine dependence, cigarettes, uncomplicated: Secondary | ICD-10-CM | POA: Insufficient documentation

## 2016-11-24 DIAGNOSIS — J45909 Unspecified asthma, uncomplicated: Secondary | ICD-10-CM | POA: Insufficient documentation

## 2016-11-24 DIAGNOSIS — M25571 Pain in right ankle and joints of right foot: Secondary | ICD-10-CM | POA: Insufficient documentation

## 2016-11-24 DIAGNOSIS — M25572 Pain in left ankle and joints of left foot: Secondary | ICD-10-CM | POA: Insufficient documentation

## 2016-11-24 DIAGNOSIS — R609 Edema, unspecified: Secondary | ICD-10-CM | POA: Insufficient documentation

## 2016-11-24 LAB — COMPREHENSIVE METABOLIC PANEL
ALBUMIN: 4.2 g/dL (ref 3.5–5.0)
ALK PHOS: 94 U/L (ref 38–126)
ALT: 16 U/L (ref 14–54)
AST: 18 U/L (ref 15–41)
Anion gap: 9 (ref 5–15)
BILIRUBIN TOTAL: 0.4 mg/dL (ref 0.3–1.2)
BUN: 10 mg/dL (ref 6–20)
CALCIUM: 9.2 mg/dL (ref 8.9–10.3)
CO2: 27 mmol/L (ref 22–32)
CREATININE: 0.63 mg/dL (ref 0.44–1.00)
Chloride: 100 mmol/L — ABNORMAL LOW (ref 101–111)
GFR calc Af Amer: >60 mL/min (ref >60)
GLUCOSE: 103 mg/dL — AB (ref 65–99)
POTASSIUM: 3.6 mmol/L (ref 3.5–5.1)
Sodium: 136 mmol/L (ref 135–145)
TOTAL PROTEIN: 7.4 g/dL (ref 6.5–8.1)

## 2016-11-24 LAB — URINALYSIS, ROUTINE W REFLEX MICROSCOPIC
Bilirubin Urine: NEGATIVE
GLUCOSE, UA: NEGATIVE mg/dL
Ketones, ur: NEGATIVE mg/dL
Leukocytes, UA: NEGATIVE
Nitrite: NEGATIVE
PH: 7 (ref 5.0–8.0)
Protein, ur: NEGATIVE mg/dL
SPECIFIC GRAVITY, URINE: 1.011 (ref 1.005–1.030)

## 2016-11-24 LAB — CBC WITH DIFFERENTIAL/PLATELET
BASOS PCT: 0 %
Basophils Absolute: 0.1 10*3/uL (ref 0.0–0.1)
Eosinophils Absolute: 0.2 10*3/uL (ref 0.0–0.7)
Eosinophils Relative: 1 %
HEMATOCRIT: 38.2 % (ref 36.0–46.0)
HEMOGLOBIN: 12.8 g/dL (ref 12.0–15.0)
LYMPHS ABS: 3.6 10*3/uL (ref 0.7–4.0)
LYMPHS PCT: 29 %
MCH: 32.9 pg (ref 26.0–34.0)
MCHC: 33.5 g/dL (ref 30.0–36.0)
MCV: 98.2 fL (ref 78.0–100.0)
MONO ABS: 0.6 10*3/uL (ref 0.1–1.0)
MONOS PCT: 5 %
NEUTROS ABS: 7.8 10*3/uL — AB (ref 1.7–7.7)
NEUTROS PCT: 65 %
Platelets: 313 10*3/uL (ref 150–400)
RBC: 3.89 MIL/uL (ref 3.87–5.11)
RDW: 12.7 % (ref 11.5–15.5)
WBC: 12.1 10*3/uL — ABNORMAL HIGH (ref 4.0–10.5)

## 2016-11-24 LAB — PREGNANCY, URINE: Preg Test, Ur: NEGATIVE

## 2016-11-24 LAB — BRAIN NATRIURETIC PEPTIDE: B Natriuretic Peptide: 16 pg/mL (ref 0.0–100.0)

## 2016-11-24 NOTE — ED Triage Notes (Signed)
Pt c/o bilateral lower leg swelling x 3 days with pain for 5. Pt denies any injury and is staying halfway house and cannot have any narcotics or benzo.

## 2016-11-25 ENCOUNTER — Emergency Department (HOSPITAL_COMMUNITY): Payer: Self-pay

## 2016-11-25 LAB — CK: CK TOTAL: 75 U/L (ref 38–234)

## 2016-11-25 MED ORDER — NAPROXEN 500 MG PO TABS: 500.0000 mg | ORAL_TABLET | Freq: Two times a day (BID) | ORAL | 0 refills | Status: AC

## 2016-11-25 NOTE — Discharge Instructions (Signed)
Your blood work is reassuring. No evidence of infection. Keep your legs elevated. Followup with your doctor. Return to the ED if you develop chest pain, shortness of breath, or any other concerns.

## 2016-11-25 NOTE — ED Notes (Signed)
Patient transported to X-ray 

## 2016-11-25 NOTE — ED Provider Notes (Signed)
[facility]Y DEPT Provider Note   CSN: [id number] Arrival date & time: 11/24/16  2042     History   Chief Complaint Chief Complaint  Patient presents with  . Leg Swelling    HPI Rae RoamJessica Lengyel is a 26 y.o. female.  Patient reports a three-day history of bilateral ankle pain and swelling. Denies any injury. Denies any calf tenderness or calf asymmetry. No fever, cough or vomiting. No chest pain or shortness of breath. No birth control use. 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 Denies any pill abuse. Denies any IV drug abuse. Good by mouth intake and urine output. No history of heart failure or COPD.  Did receive steroid injection 3 weeks ago for allergic reaction.    The history is provided by the patient.    Past Medical History:  Diagnosis Date  . Asthma     Patient Active Problem List   Diagnosis Date Noted  . Alcohol intoxication (HCC) 02/16/2016  . Hypothermia 02/16/2016    Past Surgical History:  Procedure Laterality Date  . TUBAL LIGATION    . WISDOM TOOTH EXTRACTION      OB History    No data available       Home Medications    Prior to Admission medications   Medication Sig Start Date End Date Taking? Authorizing Provider  famotidine (PEPCID) 20 MG tablet Take 1 tablet (20 mg total) by mouth 2 (two) times daily. 11/02/16   Russo, [place] N, PA-C  folic acid (FOLVITE) 1 MG tablet Take 1 tablet (1 mg total) by mouth daily. 02/18/16   Meredeth IdeLama, Gagan S, MD  thiamine 100 MG tablet Take 1 tablet (100 mg total) by mouth daily. 02/18/16   Meredeth IdeLama, Gagan S, MD    Family History Family History  Problem Relation Age of Onset  . Lung cancer Mother     Social History Social History  Substance Use Topics  . Smoking status: Current Every Day Smoker    Packs/day: 1.00    Years: 10.00    Types: Cigarettes  . Smokeless tobacco: Never Used  . Alcohol use No     Comment: intermittent     Allergies   Patient  has no known allergies.   Review of Systems Review of Systems  Constitutional: Negative for activity change, appetite change and fever.  HENT: Negative for congestion and rhinorrhea.   Eyes: Negative for visual disturbance.  Respiratory: Negative for cough, chest tightness and shortness of breath.   Cardiovascular: Positive for leg swelling. Negative for chest pain.  Gastrointestinal: Negative for abdominal pain, nausea and vomiting.  Genitourinary: Negative for dysuria, vaginal bleeding and vaginal discharge.  Musculoskeletal: Positive for arthralgias and myalgias.  Skin: Negative for rash.  Neurological: Negative for dizziness, weakness, light-headedness and headaches.   all other systems are negative except as noted in the HPI and PMH.    Physical Exam Updated Vital Signs BP 133/71   Pulse 94   Temp 98.5 F (36.9 C)   Resp 18   Ht 5\' 6"  (1.676 m)   Wt 68.9 kg (152 lb)   LMP 11/25/2016   SpO2 98%   BMI 24.53 kg/m   Physical Exam  Constitutional: She is oriented to person, place, and time. She appears well-developed and well-nourished. No distress.  HENT:  Head: Normocephalic and atraumatic.  Mouth/Throat: Oropharynx is clear and moist. No oropharyngeal exudate.  Eyes: Pupils are equal, round, and reactive to light. Conjunctivae  and EOM are normal.  Neck: Normal range of motion. Neck supple.  No meningismus.  Cardiovascular: Normal rate, regular rhythm, normal heart sounds and intact distal pulses.   No murmur heard. Pulmonary/Chest: Effort normal and breath sounds normal. No respiratory distress.  Abdominal: Soft. There is no tenderness. There is no rebound and no guarding.  Musculoskeletal: Normal range of motion. She exhibits no edema or tenderness.  No appreciable pedal edema noted. No calf tenderness. No asymmetry. Well-healed tattoos of both feet and ankles. No erythema or edema. Full range of motion of ankles without pain. Intact DP and PT pulses. No calf  asymmetry or tenderness.  Neurological: She is alert and oriented to person, place, and time. No cranial nerve deficit. She exhibits normal muscle tone. Coordination normal.   5/5 strength throughout. CN 2-12 intact.Equal grip strength.   Skin: Skin is warm.  Psychiatric: She has a normal mood and affect. Her behavior is normal.  Nursing note and vitals reviewed.    ED Treatments / Results  Labs (all labs ordered are listed, but only abnormal results are displayed) Labs Reviewed  CBC WITH DIFFERENTIAL/PLATELET - Abnormal; Notable for the following:       Result Value   WBC 12.1 (*)    Neutro Abs 7.8 (*)    All other components within normal limits  COMPREHENSIVE METABOLIC PANEL - Abnormal; Notable for the following:    Chloride 100 (*)    Glucose, Bld 103 (*)    All other components within normal limits  URINALYSIS, ROUTINE W REFLEX MICROSCOPIC - Abnormal; Notable for the following:    Hgb urine dipstick MODERATE (*)    Bacteria, UA RARE (*)    Squamous Epithelial / LPF 0-5 (*)    All other components within normal limits  PREGNANCY, URINE  BRAIN NATRIURETIC PEPTIDE  CK    EKG  EKG Interpretation None       Radiology Dg Ankle Complete Left  Result Date: 11/25/2016 CLINICAL DATA:  Medial left ankle pain and bilateral ankle swelling for 5 days. No injury. EXAM: LEFT ANKLE COMPLETE - 3+ VIEW COMPARISON:  None. FINDINGS: There is no evidence of fracture, dislocation, or joint effusion. There is no evidence of arthropathy or other focal bone abnormality. Soft tissues are unremarkable. IMPRESSION: Negative. Electronically Signed   By: Burman Nieves M.D.   On: 11/25/2016 01:22    Procedures Procedures (including critical care time)  Medications Ordered in ED Medications - No data to display   Initial Impression / Assessment and Plan / ED Course  I have reviewed the triage vital signs and the nursing notes.  Pertinent labs & imaging results that were available during  my care of the patient were reviewed by me and considered in my medical decision making (see chart for details).      Patient with bilateral ankle pain without trauma. No CP or SOB. No OCP use.  No new tattoos. No IVDU. Neurovascularly intact.   Labs reassuring. CK normal. Ankle x-ray normal. Doubt DVT. No asymmetry or Edema.  Patient be treated supportively with anti-inflammatories and elevation. There is no evidence of infection or asymmetric edema. May be due to recent steroid use.  Follow-up with PCP. Return precautions discussed. Final Clinical Impressions(s) / ED Diagnoses   Final diagnoses:  Peripheral edema    New Prescriptions New Prescriptions   No medications on file     Glynn Octave, MD 11/25/16 [phone]

## 2019-04-25 IMAGING — DX DG ANKLE COMPLETE 3+V*L*
3 series · 3 of 3 positions shown · non-contrast
Comparison: None.

CLINICAL DATA: Medial left ankle pain and bilateral ankle swelling
for 5 days. No injury.

EXAM:
LEFT ANKLE COMPLETE - 3+ VIEW

[ankle ap]
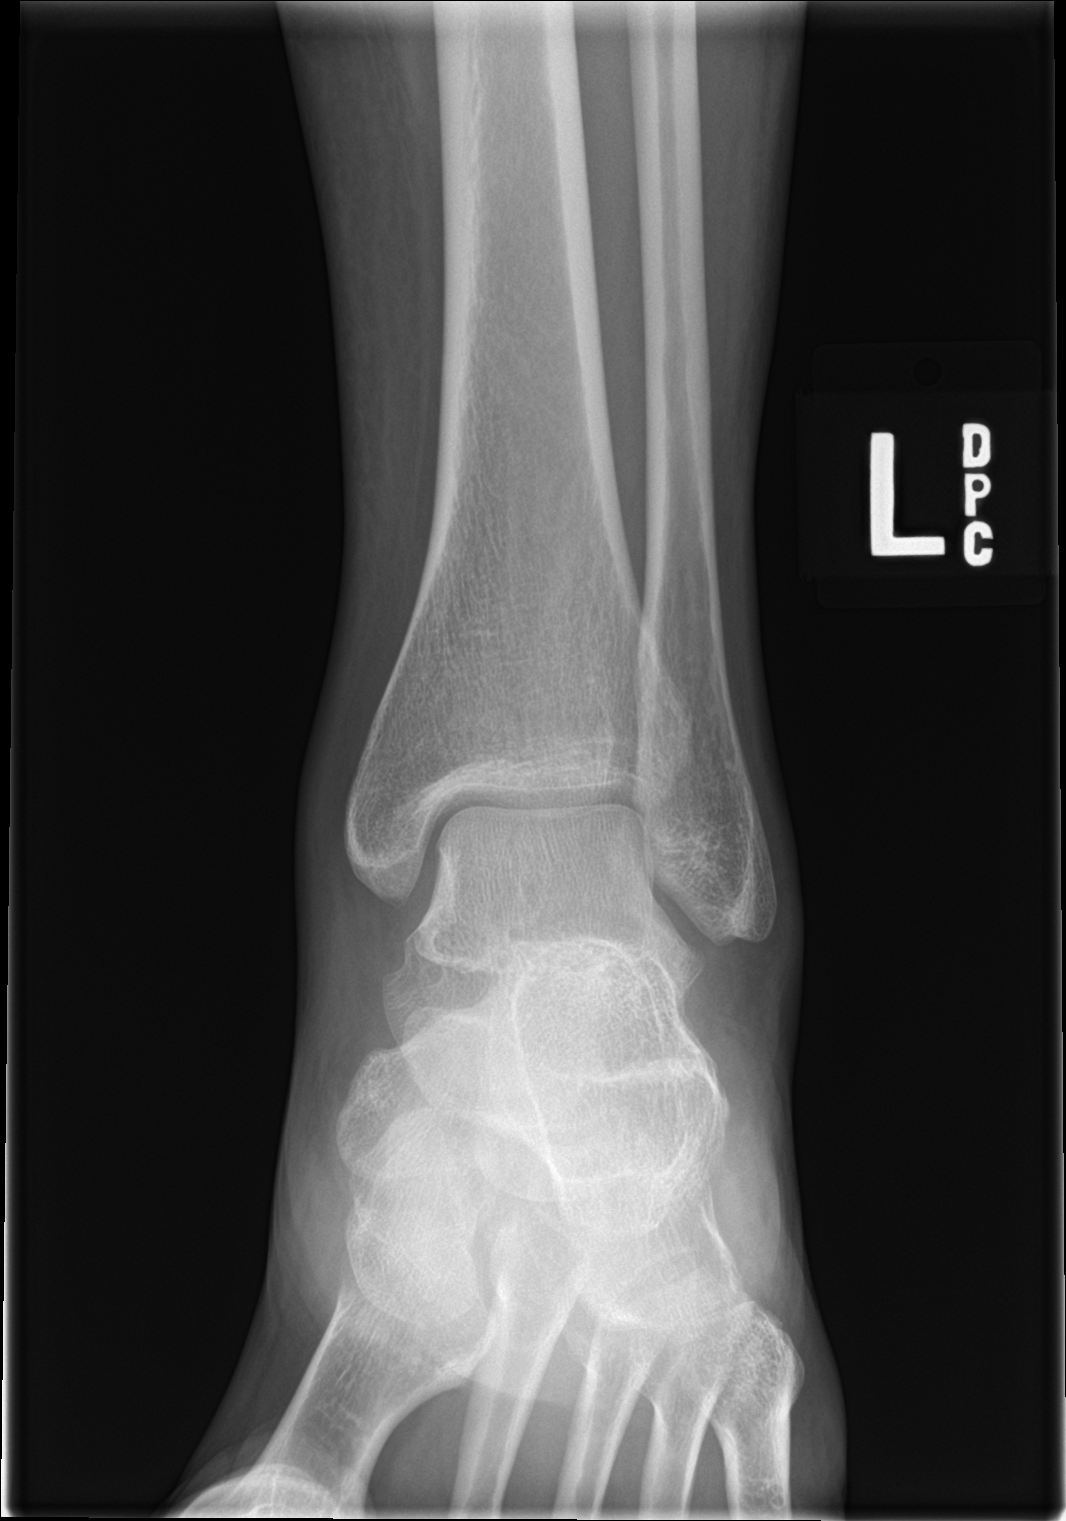

[ankle obl]
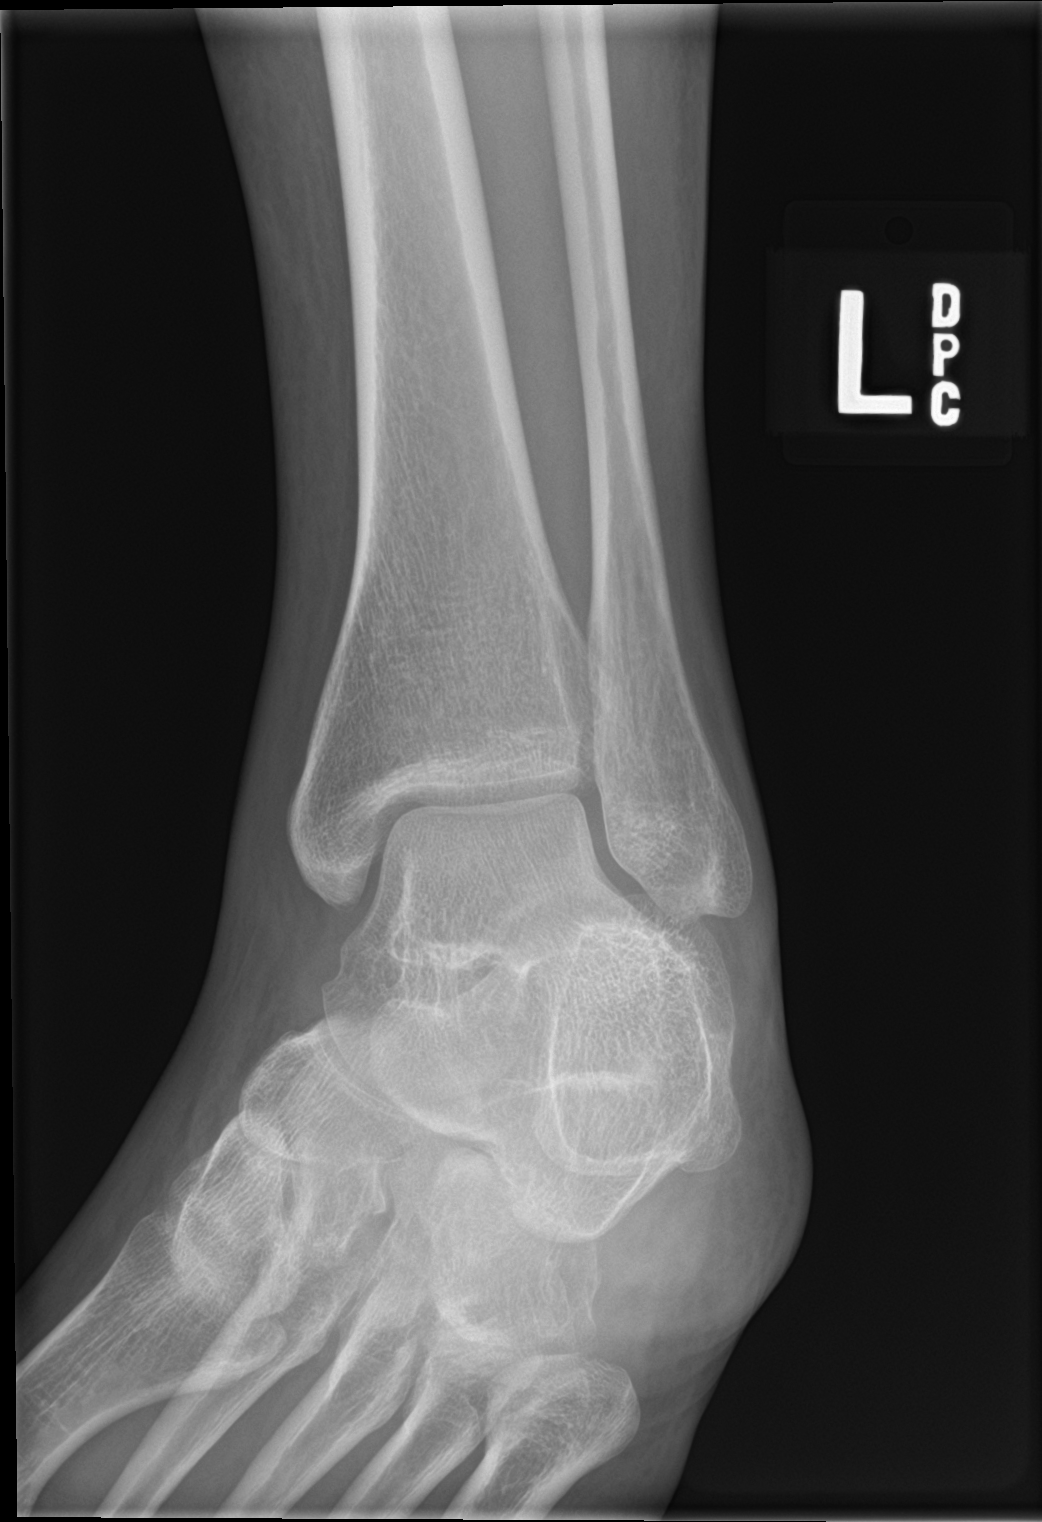

[ankle lat]
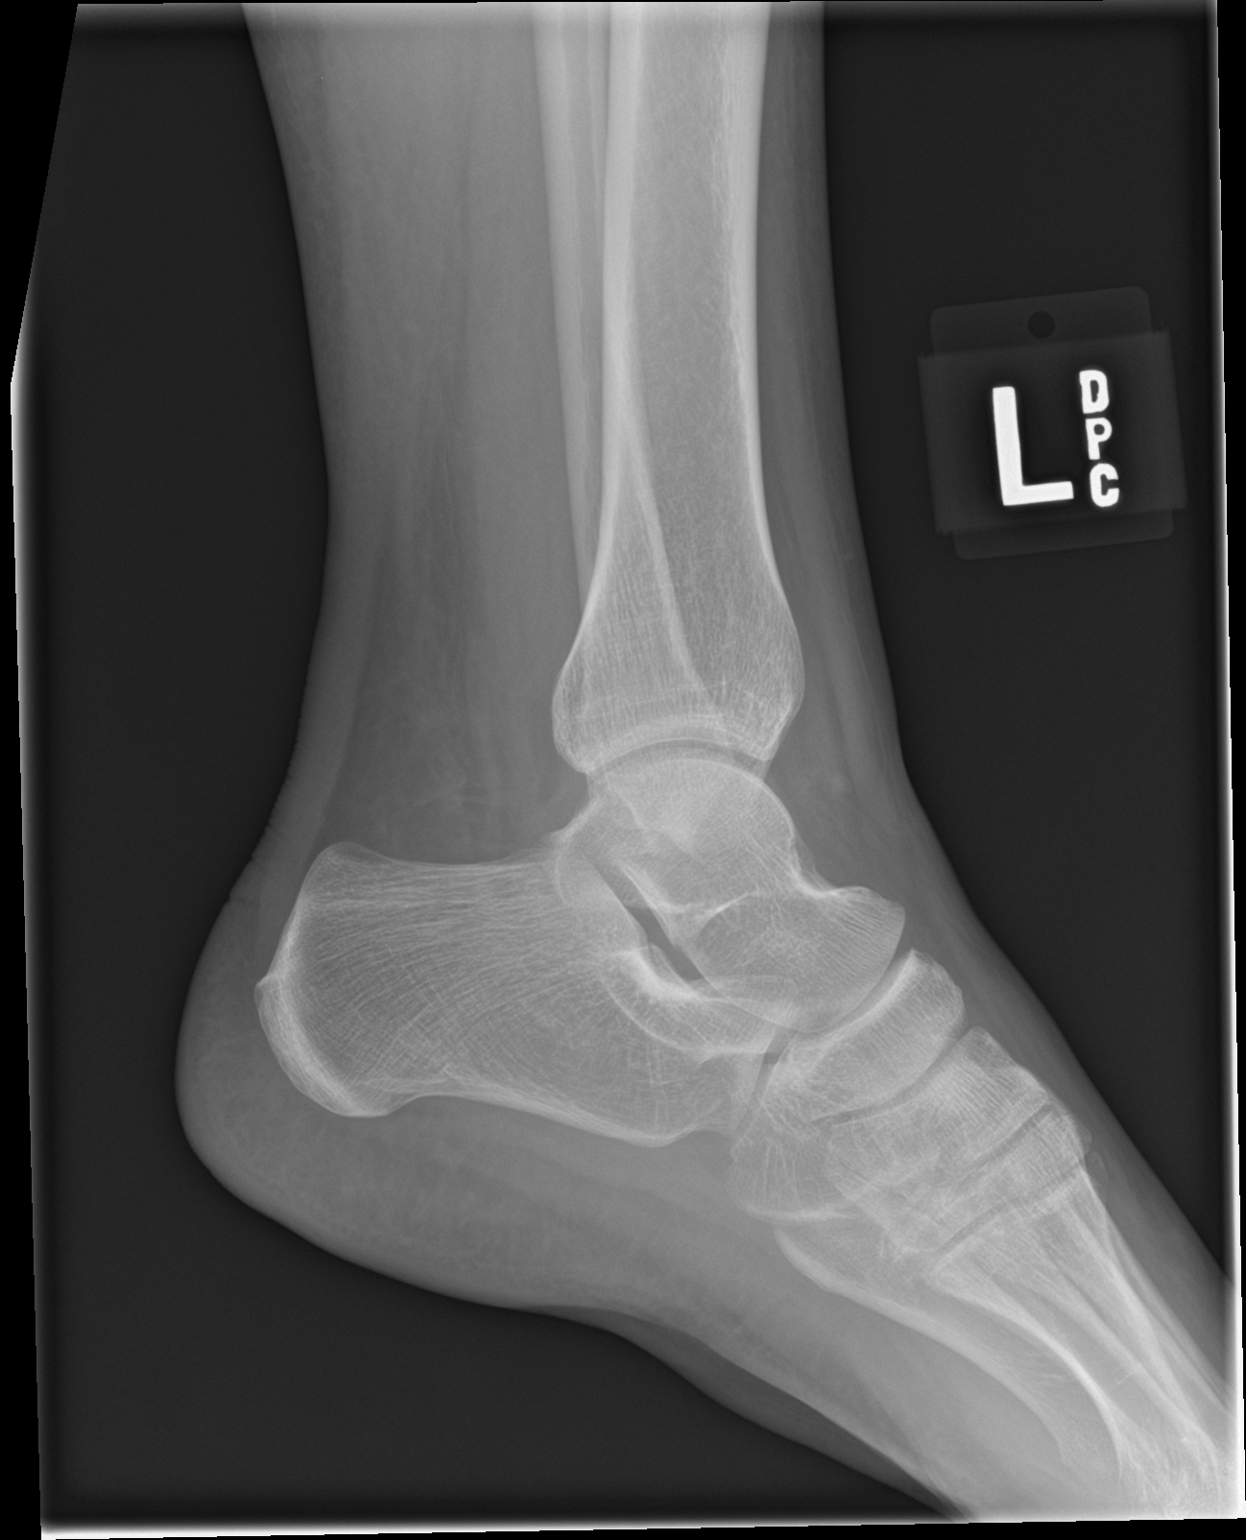

[3 of 3 positions shown; findings below may reference images not displayed]

FINDINGS: There is no evidence of fracture, dislocation, or joint effusion.
There is no evidence of arthropathy or other focal bone abnormality.
Soft tissues are unremarkable.
IMPRESSION: Negative.
